# Patient Record
Sex: Male | Born: 1975 | Race: White | Hispanic: No | Marital: Married
Health system: Southern US, Community
[De-identification: ages and names within clinical notes are randomized; demographics above are authoritative.]

---

## 2001-08-25 ENCOUNTER — Ambulatory Visit (HOSPITAL_COMMUNITY): Admission: RE | Admit: 2001-08-25 | Discharge: 2001-08-25 | Payer: Self-pay | Admitting: General Surgery

## 2004-11-17 ENCOUNTER — Ambulatory Visit (HOSPITAL_COMMUNITY): Admission: RE | Admit: 2004-11-17 | Discharge: 2004-11-17 | Payer: Self-pay | Admitting: Family Medicine

## 2011-02-27 ENCOUNTER — Other Ambulatory Visit (HOSPITAL_COMMUNITY): Payer: Self-pay | Admitting: Family Medicine

## 2011-02-27 DIAGNOSIS — R109 Unspecified abdominal pain: Secondary | ICD-10-CM

## 2011-02-27 DIAGNOSIS — R1011 Right upper quadrant pain: Secondary | ICD-10-CM

## 2011-03-01 ENCOUNTER — Other Ambulatory Visit (HOSPITAL_COMMUNITY): Payer: Self-pay

## 2011-03-08 ENCOUNTER — Ambulatory Visit (HOSPITAL_COMMUNITY)
Admission: RE | Admit: 2011-03-08 | Discharge: 2011-03-08 | Disposition: A | Discharge status: Home / Self Care | Payer: 59 | Source: Ambulatory Visit | Attending: Family Medicine | Admitting: Family Medicine

## 2011-03-08 DIAGNOSIS — R109 Unspecified abdominal pain: Secondary | ICD-10-CM

## 2011-03-08 DIAGNOSIS — R1011 Right upper quadrant pain: Secondary | ICD-10-CM | POA: Insufficient documentation

## 2011-03-08 DIAGNOSIS — R9389 Abnormal findings on diagnostic imaging of other specified body structures: Secondary | ICD-10-CM | POA: Insufficient documentation

## 2012-02-07 ENCOUNTER — Ambulatory Visit (HOSPITAL_COMMUNITY)
Admission: RE | Admit: 2012-02-07 | Discharge: 2012-02-07 | Disposition: A | Discharge status: Home / Self Care | Payer: 59 | Source: Ambulatory Visit | Attending: Family Medicine | Admitting: Family Medicine

## 2012-02-07 ENCOUNTER — Other Ambulatory Visit (HOSPITAL_COMMUNITY): Payer: Self-pay | Admitting: Family Medicine

## 2012-02-07 DIAGNOSIS — M25549 Pain in joints of unspecified hand: Secondary | ICD-10-CM

## 2012-02-07 DIAGNOSIS — X58XXXA Exposure to other specified factors, initial encounter: Secondary | ICD-10-CM | POA: Insufficient documentation

## 2012-02-07 DIAGNOSIS — S60229A Contusion of unspecified hand, initial encounter: Secondary | ICD-10-CM

## 2012-08-14 ENCOUNTER — Ambulatory Visit (HOSPITAL_COMMUNITY)
Admission: RE | Admit: 2012-08-14 | Discharge: 2012-08-14 | Disposition: A | Discharge status: Home / Self Care | Payer: 59 | Source: Ambulatory Visit | Attending: Family Medicine | Admitting: Family Medicine

## 2012-08-14 ENCOUNTER — Other Ambulatory Visit (HOSPITAL_COMMUNITY): Payer: Self-pay | Admitting: Family Medicine

## 2012-08-14 DIAGNOSIS — M25519 Pain in unspecified shoulder: Secondary | ICD-10-CM

## 2013-07-13 ENCOUNTER — Other Ambulatory Visit (HOSPITAL_COMMUNITY): Payer: Self-pay | Admitting: Family Medicine

## 2013-07-13 ENCOUNTER — Ambulatory Visit (HOSPITAL_COMMUNITY)
Admission: RE | Admit: 2013-07-13 | Discharge: 2013-07-13 | Disposition: A | Discharge status: Home / Self Care | Payer: No Typology Code available for payment source | Source: Ambulatory Visit | Attending: Family Medicine | Admitting: Family Medicine

## 2013-07-13 DIAGNOSIS — M545 Low back pain, unspecified: Secondary | ICD-10-CM | POA: Insufficient documentation

## 2014-10-17 IMAGING — CR DG LUMBAR SPINE COMPLETE 4+V
5 series · 5 of 5 positions shown · non-contrast
Comparison: None.

CLINICAL DATA: Low back pain

EXAM:
LUMBAR SPINE - COMPLETE 4+ VIEW

[view not recorded (1 of 5)]
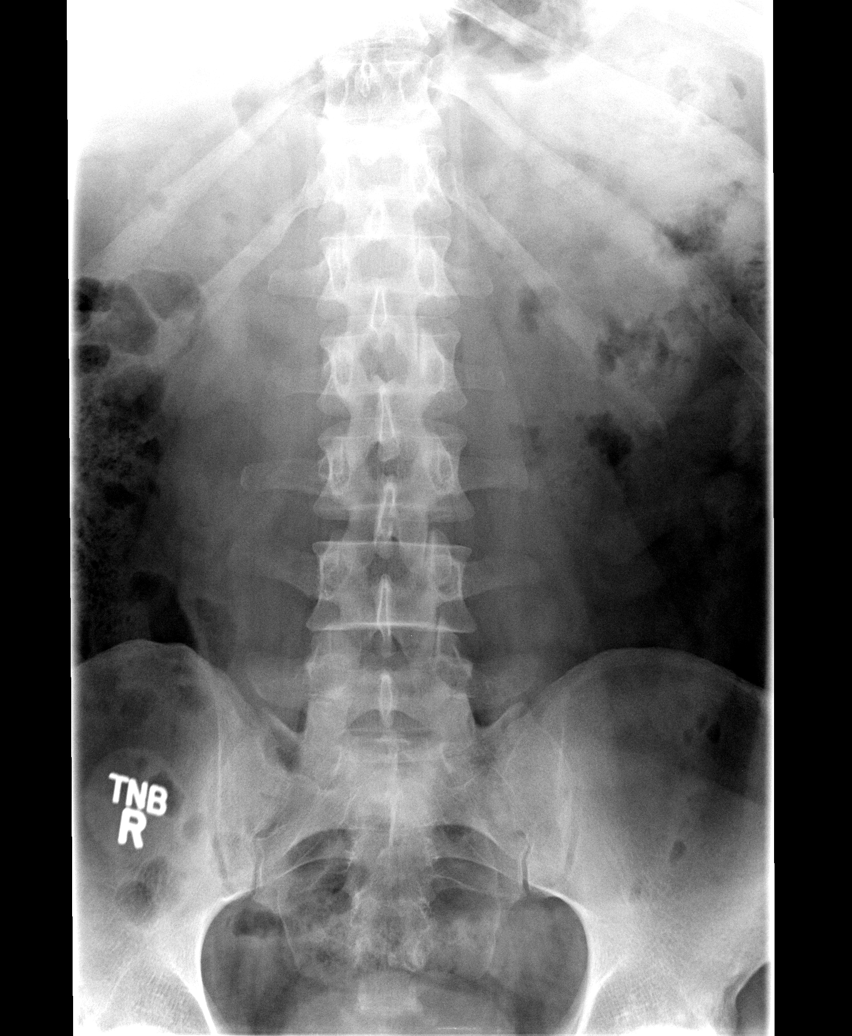

[view not recorded (2 of 5)]
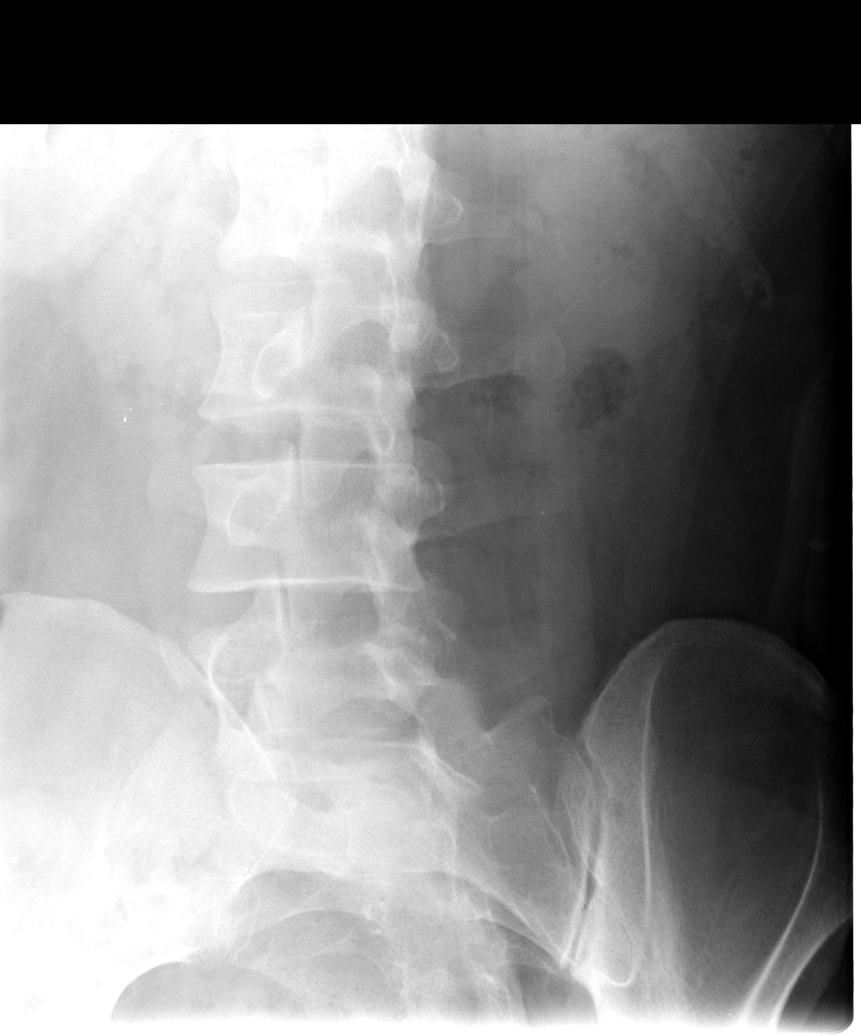

[view not recorded (3 of 5)]
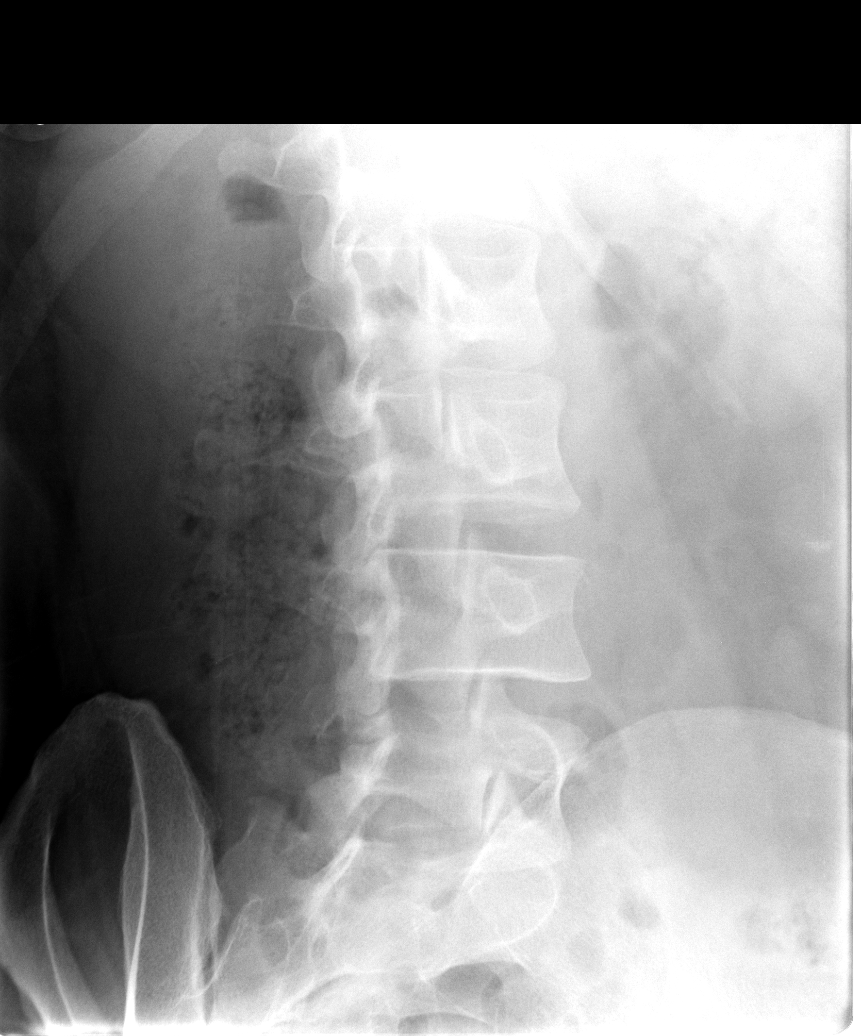

[view not recorded (4 of 5)]
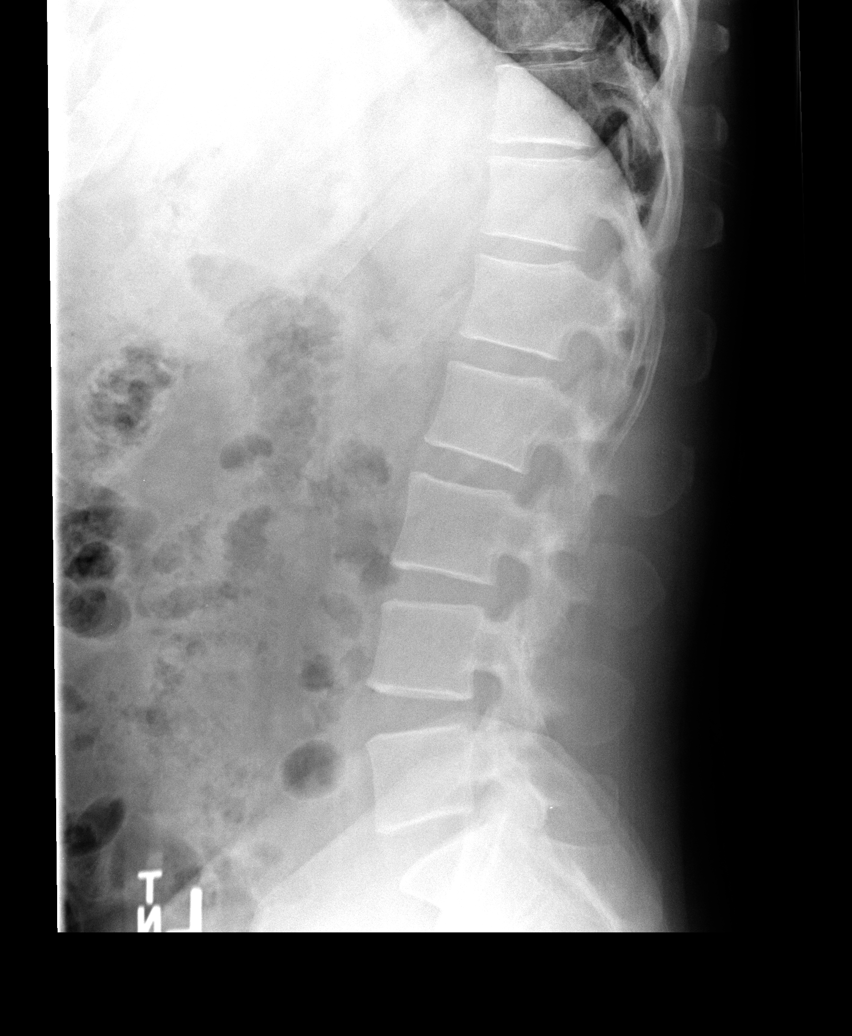

[view not recorded (5 of 5)]
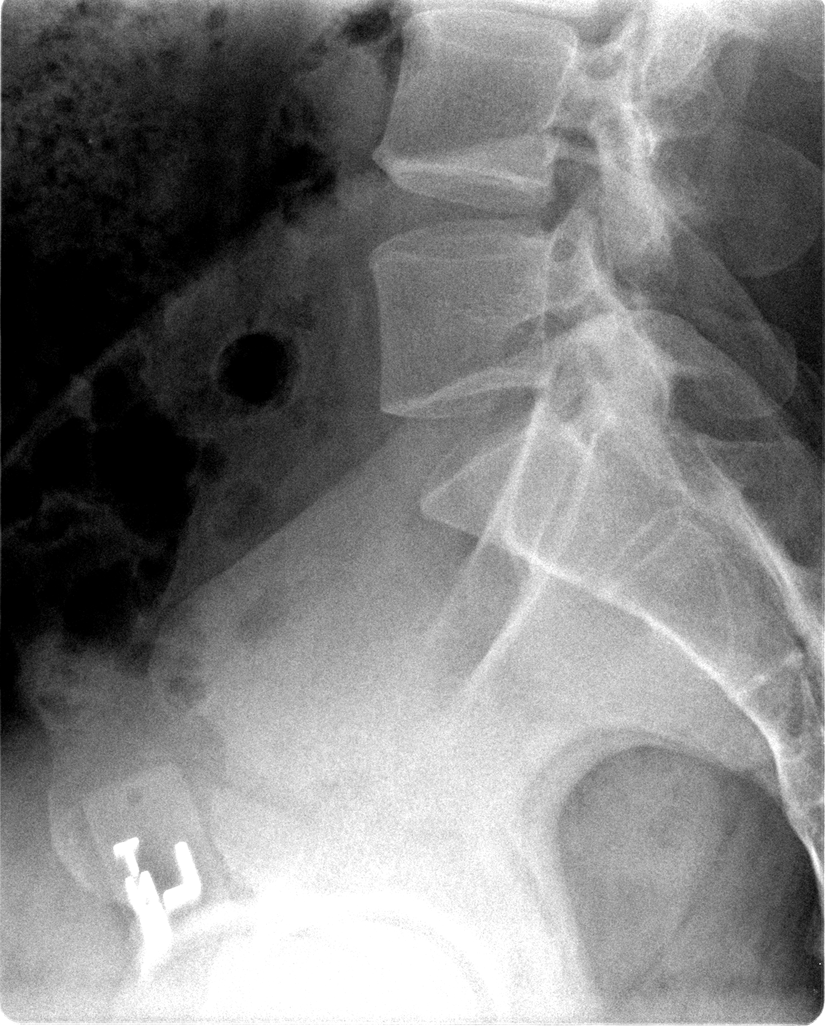

[5 of 5 positions shown; findings below may reference images not displayed]

FINDINGS: There is no evidence of lumbar spine fracture. Alignment is normal.
Intervertebral disc spaces are maintained.
IMPRESSION: Negative.

## 2016-05-04 ENCOUNTER — Ambulatory Visit
Admission: RE | Admit: 2016-05-04 | Discharge: 2016-05-04 | Disposition: A | Discharge status: Home / Self Care | Payer: BLUE CROSS/BLUE SHIELD | Source: Ambulatory Visit | Attending: Physician Assistant | Admitting: Physician Assistant

## 2016-05-04 ENCOUNTER — Other Ambulatory Visit: Payer: Self-pay | Admitting: Physician Assistant

## 2016-05-04 DIAGNOSIS — R0602 Shortness of breath: Secondary | ICD-10-CM

## 2017-08-08 IMAGING — CR DG CHEST 2V
2 series · 2 of 2 positions shown · non-contrast
Comparison: None in PACs

CLINICAL DATA: Six weeks of dyspnea on exertion

EXAM:
CHEST  2 VIEW

[w chest pa]
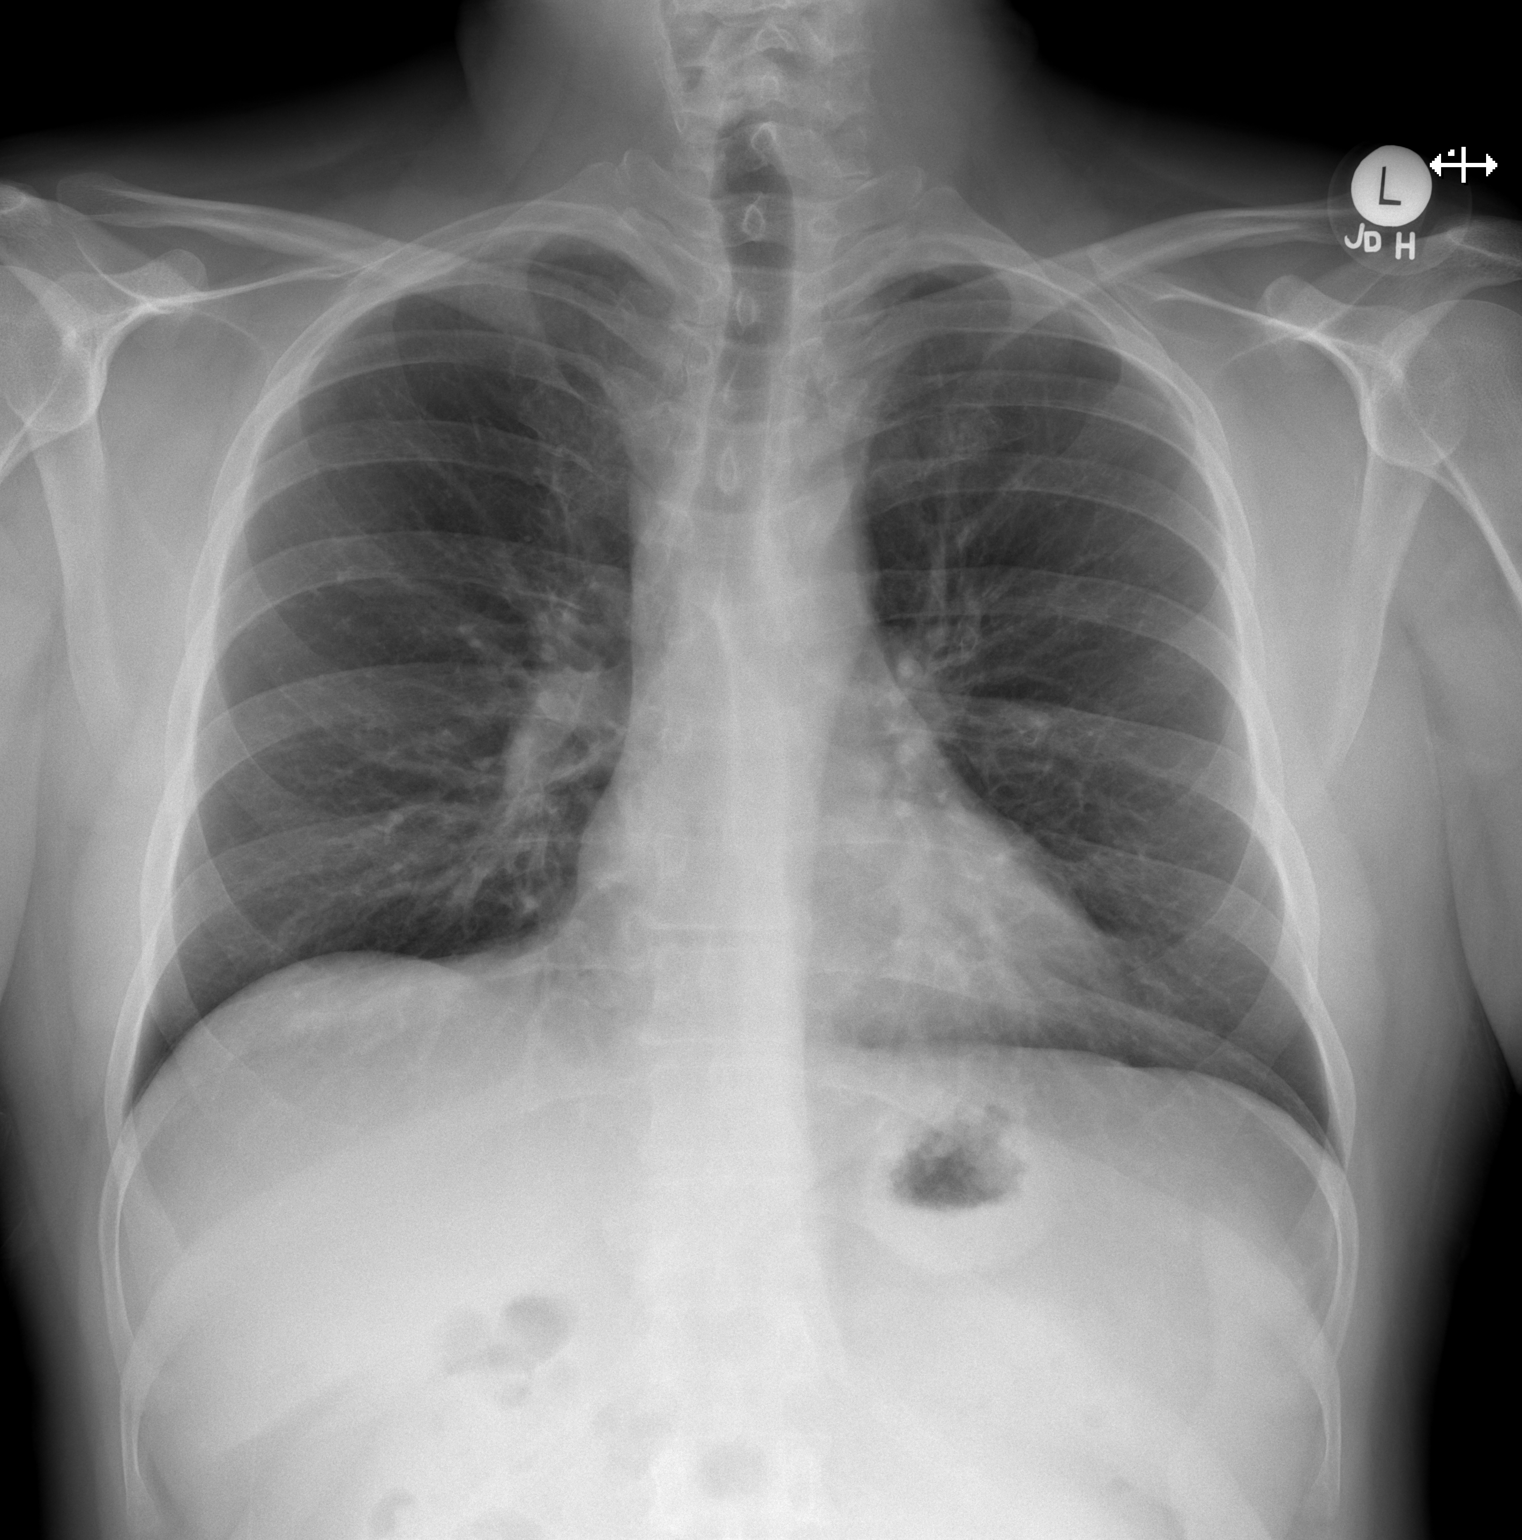

[w chest lat]
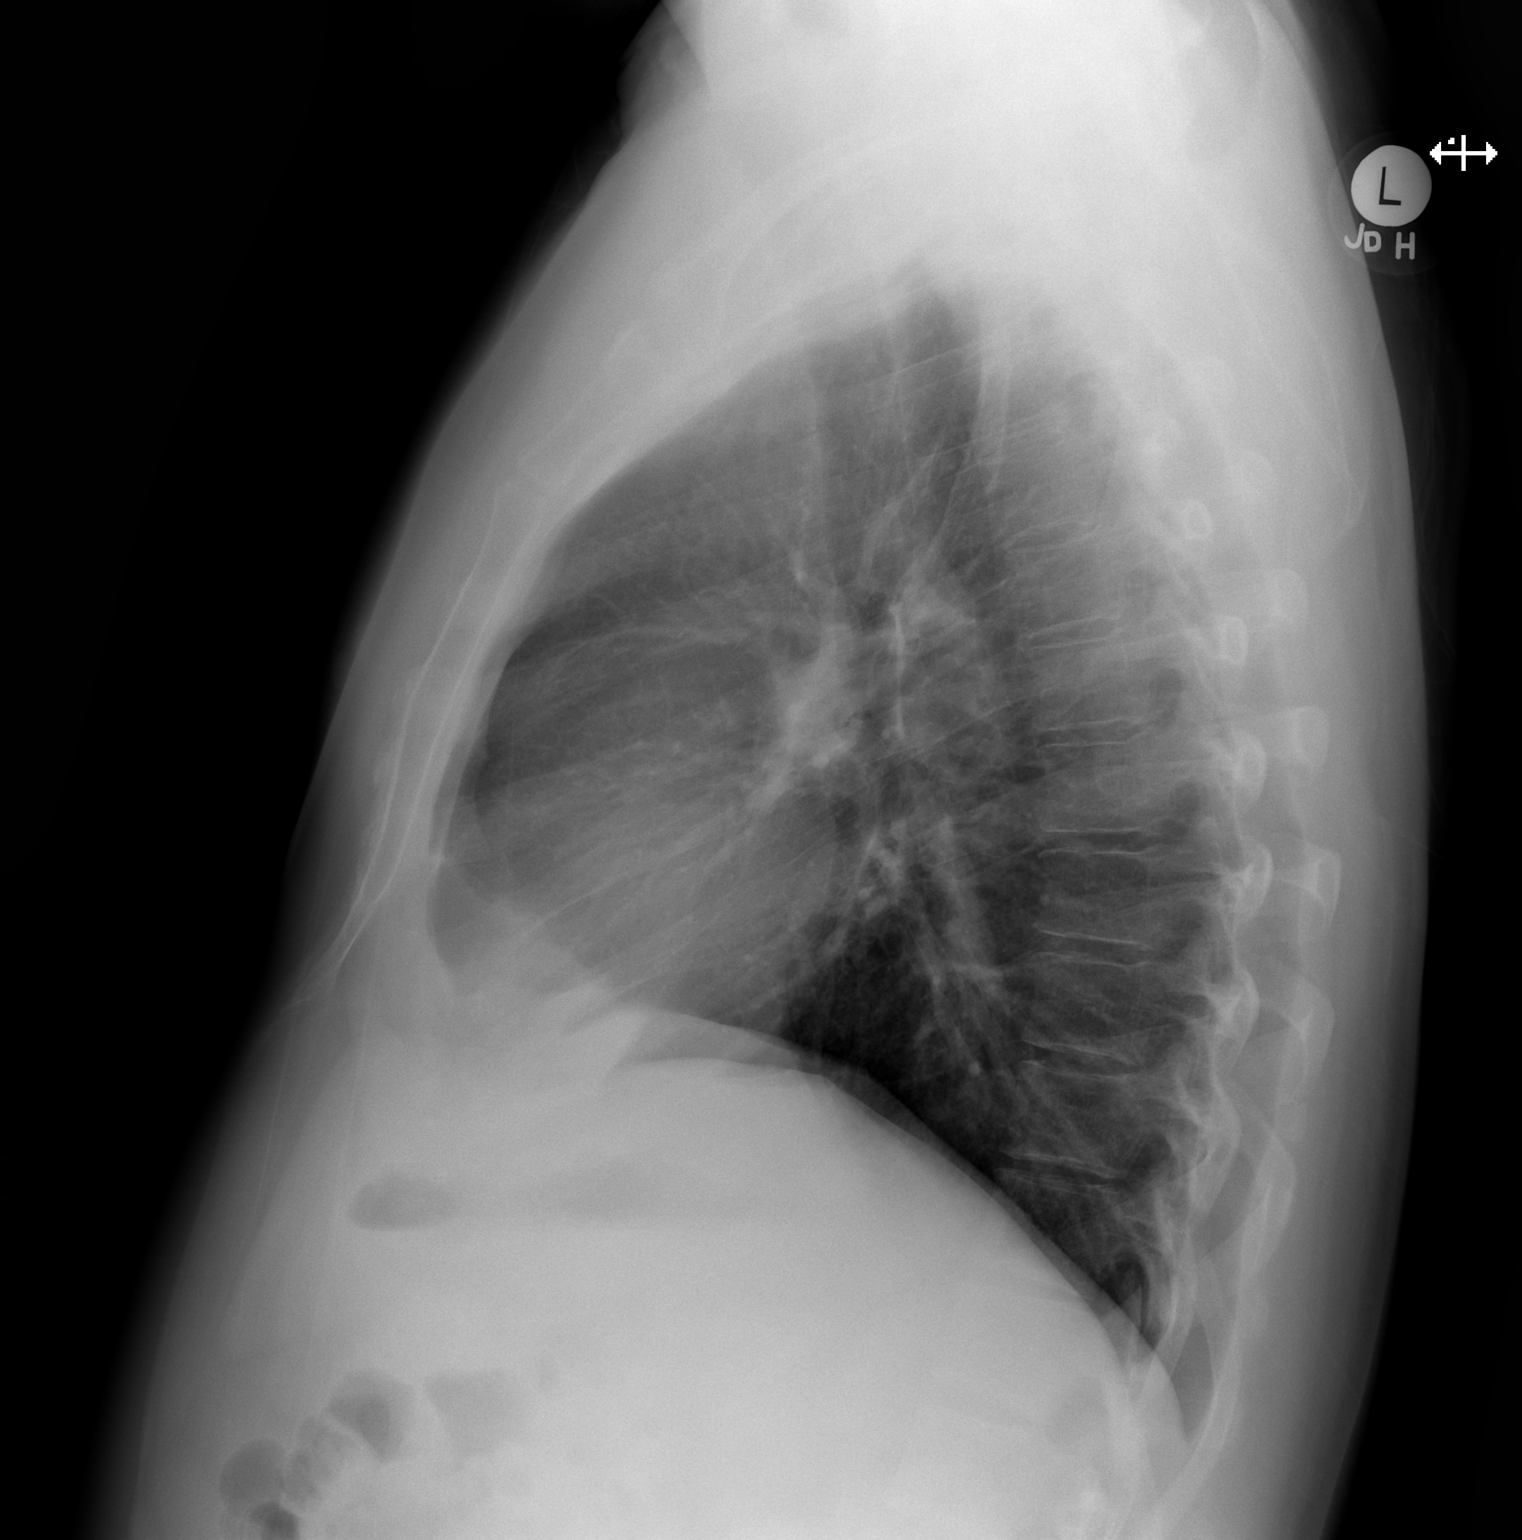

[2 of 2 positions shown; findings below may reference images not displayed]

FINDINGS: The lungs are well-expanded and clear. The heart and pulmonary
vascularity are normal. The mediastinum is normal in width. There is
no pleural effusion. The bony thorax exhibits no acute abnormality.
IMPRESSION: There is no active cardiopulmonary disease.

## 2019-06-17 ENCOUNTER — Other Ambulatory Visit: Payer: Self-pay

## 2019-06-17 DIAGNOSIS — Z20822 Contact with and (suspected) exposure to covid-19: Secondary | ICD-10-CM

## 2019-06-18 LAB — NOVEL CORONAVIRUS, NAA: SARS-CoV-2, NAA: NOT DETECTED

## 2019-08-25 ENCOUNTER — Ambulatory Visit: Payer: Managed Care, Other (non HMO) | Attending: Internal Medicine

## 2019-08-25 ENCOUNTER — Other Ambulatory Visit: Payer: Self-pay

## 2019-08-25 DIAGNOSIS — Z20822 Contact with and (suspected) exposure to covid-19: Secondary | ICD-10-CM

## 2019-08-27 LAB — NOVEL CORONAVIRUS, NAA: SARS-CoV-2, NAA: NOT DETECTED

## 2019-08-31 ENCOUNTER — Ambulatory Visit: Payer: Managed Care, Other (non HMO) | Attending: Internal Medicine

## 2019-08-31 ENCOUNTER — Other Ambulatory Visit: Payer: Self-pay

## 2019-08-31 DIAGNOSIS — Z20822 Contact with and (suspected) exposure to covid-19: Secondary | ICD-10-CM

## 2019-09-01 LAB — NOVEL CORONAVIRUS, NAA: SARS-CoV-2, NAA: NOT DETECTED

## 2019-09-28 ENCOUNTER — Other Ambulatory Visit: Payer: Self-pay

## 2019-09-28 ENCOUNTER — Ambulatory Visit: Payer: Managed Care, Other (non HMO) | Attending: Internal Medicine

## 2019-09-28 DIAGNOSIS — Z20822 Contact with and (suspected) exposure to covid-19: Secondary | ICD-10-CM

## 2019-09-29 LAB — NOVEL CORONAVIRUS, NAA: SARS-CoV-2, NAA: NOT DETECTED

## 2020-09-20 ENCOUNTER — Other Ambulatory Visit: Payer: Managed Care, Other (non HMO)

## 2020-09-20 DIAGNOSIS — Z20822 Contact with and (suspected) exposure to covid-19: Secondary | ICD-10-CM

## 2020-09-21 LAB — NOVEL CORONAVIRUS, NAA: SARS-CoV-2, NAA: NOT DETECTED

## 2020-09-21 LAB — SARS-COV-2, NAA 2 DAY TAT

## 2020-09-23 ENCOUNTER — Other Ambulatory Visit: Payer: Managed Care, Other (non HMO)

## 2020-09-23 DIAGNOSIS — Z20822 Contact with and (suspected) exposure to covid-19: Secondary | ICD-10-CM

## 2020-09-24 LAB — SARS-COV-2, NAA 2 DAY TAT

## 2020-09-24 LAB — NOVEL CORONAVIRUS, NAA: SARS-CoV-2, NAA: DETECTED — AB

## 2020-09-25 ENCOUNTER — Telehealth: Payer: Self-pay | Admitting: Family

## 2020-09-25 ENCOUNTER — Telehealth: Payer: Self-pay | Admitting: Nurse Practitioner

## 2020-09-25 NOTE — Telephone Encounter (Signed)
2nd ATTEMPT. Called to discuss with patient about COVID-19 symptoms and the use of one of the available treatments for those with mild to moderate Covid symptoms and at a high risk of hospitalization.  Pt appears to qualify for outpatient treatment due to co-morbid conditions and/or a member of an at-risk group in accordance with the FDA Emergency Use Authorization.    Symptom onset:  Vaccinated:  Booster?  Immunocompromised?  Qualifiers: BMI 33; hypertension, sleep apnea   I attempted to gather additional information and was unable to reach Russell Howell via phone. I left a voicemail with call back number. Previous MyChart message sent and read.  Russell Eke, NP 09/25/2020 2:46 PM

## 2020-09-25 NOTE — Telephone Encounter (Signed)
Called to Discuss with patient about Covid symptoms and the use of the monoclonal antibody infusion for those with mild to moderate Covid symptoms and at a high risk of hospitalization.     Pt appears to qualify for this infusion due to co-morbid conditions and/or a member of an at-risk group in accordance with the FDA Emergency Use Authorization.    Unable to reach pt. Voicemail left and My Chart message sent.   Symptom onset: Unsure Vaccinated: None on file Qualified for Infusion: Will need to speak further with patient. (BMI, HtN)  Willette Alma, NP [facility]  [phone]

## 2020-09-26 ENCOUNTER — Other Ambulatory Visit: Payer: Managed Care, Other (non HMO)

## 2020-10-04 ENCOUNTER — Other Ambulatory Visit: Payer: Managed Care, Other (non HMO)

## 2020-10-04 DIAGNOSIS — Z20822 Contact with and (suspected) exposure to covid-19: Secondary | ICD-10-CM

## 2020-10-05 LAB — NOVEL CORONAVIRUS, NAA: SARS-CoV-2, NAA: NOT DETECTED

## 2020-10-05 LAB — SARS-COV-2, NAA 2 DAY TAT

## 2023-09-23 ENCOUNTER — Other Ambulatory Visit (HOSPITAL_BASED_OUTPATIENT_CLINIC_OR_DEPARTMENT_OTHER): Payer: Self-pay

## 2023-09-23 MED ORDER — ZEPBOUND 2.5 MG/0.5ML ~~LOC~~ SOAJ
2.5000 mg | SUBCUTANEOUS | 0 refills | Status: AC
  Filled 2023-09-23: qty 2, 28d supply, fill #0

## 2023-10-03 ENCOUNTER — Other Ambulatory Visit (HOSPITAL_BASED_OUTPATIENT_CLINIC_OR_DEPARTMENT_OTHER): Payer: Self-pay
